# Patient Record
Sex: Male | Born: 1970 | Race: Black or African American | Hispanic: No | Marital: Married | Smoking: Never smoker
Health system: Southern US, Community
[De-identification: ages and names within clinical notes are randomized; demographics above are authoritative.]

---

## 2004-08-14 ENCOUNTER — Emergency Department: Payer: Self-pay | Admitting: Emergency Medicine

## 2005-01-07 ENCOUNTER — Emergency Department: Payer: Self-pay | Admitting: Internal Medicine

## 2005-04-17 ENCOUNTER — Ambulatory Visit: Payer: Self-pay | Admitting: Family Medicine

## 2005-06-09 ENCOUNTER — Ambulatory Visit: Payer: Self-pay | Admitting: Gastroenterology

## 2005-06-16 ENCOUNTER — Emergency Department: Payer: Self-pay | Admitting: Emergency Medicine

## 2005-06-18 ENCOUNTER — Ambulatory Visit: Payer: Self-pay | Admitting: Gastroenterology

## 2010-05-09 ENCOUNTER — Ambulatory Visit: Payer: Self-pay | Admitting: Internal Medicine

## 2015-06-22 ENCOUNTER — Emergency Department: Payer: BLUE CROSS/BLUE SHIELD

## 2015-06-22 ENCOUNTER — Encounter: Payer: Self-pay | Admitting: Emergency Medicine

## 2015-06-22 ENCOUNTER — Emergency Department
Admission: EM | Admit: 2015-06-22 | Discharge: 2015-06-22 | Disposition: A | Discharge status: Home / Self Care | Payer: BLUE CROSS/BLUE SHIELD | Attending: Emergency Medicine | Admitting: Emergency Medicine

## 2015-06-22 DIAGNOSIS — J189 Pneumonia, unspecified organism: Secondary | ICD-10-CM | POA: Diagnosis not present

## 2015-06-22 DIAGNOSIS — J984 Other disorders of lung: Secondary | ICD-10-CM

## 2015-06-22 DIAGNOSIS — R079 Chest pain, unspecified: Secondary | ICD-10-CM | POA: Diagnosis present

## 2015-06-22 LAB — BASIC METABOLIC PANEL
ANION GAP: 7 (ref 5–15)
BUN: 19 mg/dL (ref 6–20)
CHLORIDE: 104 mmol/L (ref 101–111)
CO2: 28 mmol/L (ref 22–32)
Calcium: 9.3 mg/dL (ref 8.9–10.3)
Creatinine, Ser: 1.12 mg/dL (ref 0.61–1.24)
GFR calc non Af Amer: >60 mL/min (ref >60)
Glucose, Bld: 89 mg/dL (ref 65–99)
POTASSIUM: 4.3 mmol/L (ref 3.5–5.1)
SODIUM: 139 mmol/L (ref 135–145)

## 2015-06-22 LAB — TROPONIN I: Troponin I: <0.03 ng/mL (ref <0.031)

## 2015-06-22 LAB — CBC
HCT: 38.4 % — ABNORMAL LOW (ref 40.0–52.0)
HEMOGLOBIN: 12.8 g/dL — AB (ref 13.0–18.0)
MCH: 30.9 pg (ref 26.0–34.0)
MCHC: 33.2 g/dL (ref 32.0–36.0)
MCV: 93 fL (ref 80.0–100.0)
Platelets: 165 10*3/uL (ref 150–440)
RBC: 4.13 MIL/uL — AB (ref 4.40–5.90)
RDW: 13.2 % (ref 11.5–14.5)
WBC: 7.8 10*3/uL (ref 3.8–10.6)

## 2015-06-22 MED ORDER — GUAIFENESIN-CODEINE 100-10 MG/5ML PO SOLN: 10.0000 mL | ORAL | Status: DC | PRN

## 2015-06-22 MED ORDER — LEVOFLOXACIN 750 MG PO TABS: 750.0000 mg | ORAL_TABLET | Freq: Every day | ORAL | Status: AC

## 2015-06-22 NOTE — ED Notes (Signed)
Pt to ed with c/o chest pain and sob that started about 4 hours pta. States it is constant and achy.  Pt also reports weakness associated with the pain.  Pt rates pain 7/10 at this time.

## 2015-06-22 NOTE — ED Notes (Signed)
Pt verbalized understanding of discharge instructions. NAD at this time. 

## 2015-06-22 NOTE — Discharge Instructions (Signed)

## 2015-06-22 NOTE — ED Provider Notes (Signed)
[facility] Emergency Department Provider Note  ____________________________________________  Time seen: Approximately 4:20 PM  I have reviewed the triage vital signs and the nursing notes.   HISTORY  Chief Complaint Chest Pain    HPI Dwayne Tucker is a 45 y.o. male who presents with the complaints of chest pain shortness of breath and heaviness is started about 4 hours prior to arrival. States onset this morning. Also reports some weakness associated with the pain. States it hurts to take a deep breath. Past medical history is unremarkable. Denies any nausea vomiting no sweating. Denies any radiation of pain to the neck or arm.   History reviewed. No pertinent past medical history.  There are no active problems to display for this patient.   History reviewed. No pertinent past surgical history.  Current Outpatient Rx  Name  Route  Sig  Dispense  Refill  . guaiFENesin-codeine 100-10 MG/5ML syrup   Oral   Take 10 mLs by mouth every 4 (four) hours as needed for cough.   180 mL   0   . levofloxacin (LEVAQUIN) 750 MG tablet   Oral   Take 1 tablet (750 mg total) by mouth daily.   10 tablet   0     Allergies Review of patient's allergies indicates no known allergies.  History reviewed. No pertinent family history.  Social History Social History  Substance Use Topics  . Smoking status: Never Smoker   . Smokeless tobacco: None  . Alcohol Use: Yes    Review of Systems Constitutional: No fever/chills Eyes: No visual changes. ENT: No sore throat. Cardiovascular: Positive for chest pain. Respiratory: Denies shortness of breath.Positive for heaviness and hurts to take deep breath. Musculoskeletal: Negative for back pain. Skin: Negative for rash. Neurological: Negative for headaches, focal weakness or numbness.  10-point ROS otherwise negative.  ____________________________________________   PHYSICAL EXAM:  VITAL SIGNS: ED Triage  Vitals  Enc Vitals Group     BP 06/22/15 1232 116/68 mmHg     Pulse Rate 06/22/15 1232 98     Resp 06/22/15 1232 20     Temp 06/22/15 1232 99.4 F (37.4 C)     Temp Source 06/22/15 1232 Oral     SpO2 06/22/15 1232 98 %     Weight 06/22/15 1232 155 lb (70.308 kg)     Height 06/22/15 1232 5\' 8"  (1.727 m)     Head Cir --      Peak Flow --      Pain Score 06/22/15 1232 7     Pain Loc --      Pain Edu? --      Excl. in GC? --     Constitutional: Alert and oriented. Well appearing and in no acute distress. Eyes: Conjunctivae are normal. PERRL. EOMI. Head: Atraumatic. Nose: No congestion/rhinnorhea. Mouth/Throat: Mucous membranes are moist.  Oropharynx non-erythematous. Neck: No stridor.   Cardiovascular: Normal rate, regular rhythm. Grossly normal heart sounds.  Good peripheral circulation. Respiratory: Normal respiratory effort.  No retractions. Lungs Decreased breath sounds with some coarse scattered rhonchi noted on the right greater than left. Gastrointestinal: Soft and nontender. No distention. No abdominal bruits. No CVA tenderness. Musculoskeletal: No lower extremity tenderness nor edema.  No joint effusions. Neurologic:  Normal speech and language. No gross focal neurologic deficits are appreciated. No gait instability. Skin:  Skin is warm, dry and intact. No rash noted. Psychiatric: Mood and affect are normal. Speech and behavior are normal.  ____________________________________________   LABS (  all labs ordered are listed, but only abnormal results are displayed)  Labs Reviewed  CBC - Abnormal; Notable for the following:    RBC 4.13 (*)    Hemoglobin 12.8 (*)    HCT 38.4 (*)    All other components within normal limits  BASIC METABOLIC PANEL  TROPONIN I   ____________________________________________  EKG  Normal sinus rhythm with no acute STEMI. No EKG changes. ____________________________________________  RADIOLOGY  Acute bilateral pneumonitis  noted. ____________________________________________   PROCEDURES  Procedure(s) performed: None  Critical Care performed: No  ____________________________________________   INITIAL IMPRESSION / ASSESSMENT AND PLAN / ED COURSE  Pertinent labs & imaging results that were available during my care of the patient were reviewed by me and considered in my medical decision making (see chart for details).  Acute bilateral pneumonia. Rx given for Levaquin 750 mg daily 10 days. Rx given for guaifenesin with codeine. USE 48 hours. Patient follow-up PCP or return to ER with any worsening symptomology. Encouraged use of Motrin and Tylenol as needed for fever chills or body aches. ____________________________________________   FINAL CLINICAL IMPRESSION(S) / ED DIAGNOSES  Final diagnoses:  Pneumonitis     This chart was dictated using voice recognition software/Dragon. Despite best efforts to proofread, errors can occur which can change the meaning. Any change was purely unintentional.   Evangeline Dakin, PA-C 06/22/15 1626  Sharman Cheek, MD 06/25/15 [phone]

## 2016-09-08 ENCOUNTER — Emergency Department
Admission: EM | Admit: 2016-09-08 | Discharge: 2016-09-08 | Disposition: A | Discharge status: Home / Self Care | Payer: BLUE CROSS/BLUE SHIELD | Attending: Emergency Medicine | Admitting: Emergency Medicine

## 2016-09-08 ENCOUNTER — Encounter: Payer: Self-pay | Admitting: *Deleted

## 2016-09-08 DIAGNOSIS — Y929 Unspecified place or not applicable: Secondary | ICD-10-CM | POA: Insufficient documentation

## 2016-09-08 DIAGNOSIS — Z23 Encounter for immunization: Secondary | ICD-10-CM | POA: Insufficient documentation

## 2016-09-08 DIAGNOSIS — Y939 Activity, unspecified: Secondary | ICD-10-CM | POA: Diagnosis not present

## 2016-09-08 DIAGNOSIS — S6991XA Unspecified injury of right wrist, hand and finger(s), initial encounter: Secondary | ICD-10-CM | POA: Diagnosis present

## 2016-09-08 DIAGNOSIS — W268XXA Contact with other sharp object(s), not elsewhere classified, initial encounter: Secondary | ICD-10-CM | POA: Insufficient documentation

## 2016-09-08 DIAGNOSIS — Y990 Civilian activity done for income or pay: Secondary | ICD-10-CM | POA: Insufficient documentation

## 2016-09-08 DIAGNOSIS — S61411A Laceration without foreign body of right hand, initial encounter: Secondary | ICD-10-CM | POA: Diagnosis not present

## 2016-09-08 MED ORDER — TETANUS-DIPHTH-ACELL PERTUSSIS 5-2.5-18.5 LF-MCG/0.5 IM SUSP
0.5000 mL | Freq: Once | INTRAMUSCULAR | Status: AC
  Administered 2016-09-08: 0.5 mL via INTRAMUSCULAR
  Filled 2016-09-08: qty 0.5

## 2016-09-08 MED ORDER — CEPHALEXIN 500 MG PO CAPS: 500.0000 mg | ORAL_CAPSULE | Freq: Three times a day (TID) | ORAL | 0 refills | Status: AC

## 2016-09-08 MED ORDER — LIDOCAINE HCL 1 % IJ SOLN
5.0000 mL | Freq: Once | INTRAMUSCULAR | Status: AC
  Administered 2016-09-08: 5 mL
  Filled 2016-09-08: qty 5

## 2016-09-08 NOTE — ED Notes (Signed)
Wound dressed with sterile dressing and home care discussed. Pt in NAD at this time. Pt verbalized understanding of follow up care and suture removal.

## 2016-09-08 NOTE — ED Provider Notes (Signed)
[facility] Emergency Department Provider Note  ____________________________________________  Time seen: Approximately 10:09 PM  I have reviewed the triage vital signs and the nursing notes.   HISTORY  Chief Complaint Laceration    HPI Dwayne Tucker is a 46 y.o. male presenting to the emergency department with a 3 cm right hand laceration after patient cut hand on a piece of metal at work. Patient denies radiculopathy, weakness or changes in sensation of the right upper extremity. Tetanus status is not up-to-date. No alleviating measures been attempted.   History reviewed. No pertinent past medical history.  There are no active problems to display for this patient.   History reviewed. No pertinent surgical history.  Prior to Admission medications   Medication Sig Start Date End Date Taking? Authorizing Provider  cephALEXin (KEFLEX) 500 MG capsule Take 1 capsule (500 mg total) by mouth 3 (three) times daily. 09/08/16 09/18/16  Orvil Feil, PA-C  guaiFENesin-codeine 100-10 MG/5ML syrup Take 10 mLs by mouth every 4 (four) hours as needed for cough. 06/22/15   Beers, Charmayne Sheer, PA-C    Allergies Patient has no known allergies.  History reviewed. No pertinent family history.  Social History Social History  Substance Use Topics  . Smoking status: Never Smoker  . Smokeless tobacco: Never Used  . Alcohol use Yes     Review of Systems  Constitutional: No fever/chills Eyes: No visual changes. No discharge ENT: No upper respiratory complaints. Cardiovascular: no chest pain. Respiratory: no cough. No SOB. Musculoskeletal: Negative for musculoskeletal pain. Skin: Patient has right hand laceration.  Neurological: Negative for headaches, focal weakness or numbness.   ____________________________________________   PHYSICAL EXAM:  VITAL SIGNS: ED Triage Vitals  Enc Vitals Group     BP 09/08/16 2051 131/76     Pulse Rate 09/08/16 2051 85    Resp 09/08/16 2051 16     Temp 09/08/16 2051 98.6 F (37 C)     Temp Source 09/08/16 2051 Oral     SpO2 09/08/16 2051 98 %     Weight 09/08/16 2046 160 lb (72.6 kg)     Height 09/08/16 2046 5\' 8"  (1.727 m)     Head Circumference --      Peak Flow --      Pain Score --      Pain Loc --      Pain Edu? --      Excl. in GC? --      Constitutional: Alert and oriented. Well appearing and in no acute distress. Eyes: Conjunctivae are normal. PERRL. EOMI. Head: Atraumatic.  Cardiovascular: Normal rate, regular rhythm. Normal S1 and S2.  Good peripheral circulation. Respiratory: Normal respiratory effort without tachypnea or retractions. Lungs CTAB. Good air entry to the bases with no decreased or absent breath sounds. Musculoskeletal: Full range of motion to all extremities. No gross deformities appreciated. Neurologic:  Normal speech and language. No gross focal neurologic deficits are appreciated.  Skin: Patient has 3 cm right hand laceration. Psychiatric: Mood and affect are normal. Speech and behavior are normal. Patient exhibits appropriate insight and judgement.   ____________________________________________   LABS (all labs ordered are listed, but only abnormal results are displayed)  Labs Reviewed - No data to display ____________________________________________  EKG   ____________________________________________  RADIOLOGY   No results found.  ____________________________________________    PROCEDURES  Procedure(s) performed:    Procedures    Medications  Tdap (BOOSTRIX) injection 0.5 mL (not administered)  lidocaine (XYLOCAINE) 1 % (  with pres) injection 5 mL (not administered)    LACERATION REPAIR Performed by: Orvil FeilJaclyn M Woods Authorized by: Orvil FeilJaclyn M Woods Consent: Verbal consent obtained. Risks and benefits: risks, benefits and alternatives were discussed Consent given by: patient Patient identity confirmed: provided demographic data Prepped and  Draped in normal sterile fashion Wound explored  Laceration Location: Right hand  Laceration Length: 3 cm  No Foreign Bodies seen or palpated  Anesthesia: local infiltration  Local anesthetic: lidocaine 1% without epinephrine  Anesthetic total: 3 ml  Irrigation method: syringe Amount of cleaning: standard  Skin closure: 4-0 Ethilon   Number of sutures: 7  Technique: Simple Interrupted  Patient tolerance: Patient tolerated the procedure well with no immediate complications.   ____________________________________________   INITIAL IMPRESSION / ASSESSMENT AND PLAN / ED COURSE  Pertinent labs & imaging results that were available during my care of the patient were reviewed by me and considered in my medical decision making (see chart for details).  Review of the [place] CSRS was performed in accordance of the [facility] prior to dispensing any controlled drugs.     Assessment and plan: Laceration of right hand without foreign body, initial encounter. Patient presents to the emergency department with a 3 cm right hand laceration. Patient underwent laceration repair in the emergency department without complication. Patient was advised to have sutures removed by primary care 9 days. Patient's tetanus status was updated in the emergency department. He was discharged with Keflex. Vital signs were reassuring prior to discharge. All patient questions were answered.   ____________________________________________  FINAL CLINICAL IMPRESSION(S) / ED DIAGNOSES  Final diagnoses:  Laceration of right hand without foreign body, initial encounter      NEW MEDICATIONS STARTED DURING THIS VISIT:  New Prescriptions   CEPHALEXIN (KEFLEX) 500 MG CAPSULE    Take 1 capsule (500 mg total) by mouth 3 (three) times daily.        This chart was dictated using voice recognition software/Dragon. Despite best efforts to proofread, errors can occur which can change the meaning. Any change was purely  unintentional.    Gasper LloydWoods, Jaclyn M, PA-C 09/08/16 2214    Sharman CheekStafford, Phillip, MD 09/09/16 [phone]

## 2016-09-08 NOTE — ED Triage Notes (Signed)
Pt reports being cut by metal piece on right hand. Bleeding controlled at this time. Pt reports having feeling in hand and movement intact. Cap refill < 3 sec.

## 2018-09-30 ENCOUNTER — Encounter: Payer: Self-pay | Admitting: Emergency Medicine

## 2018-09-30 ENCOUNTER — Emergency Department: Payer: BC Managed Care – PPO

## 2018-09-30 ENCOUNTER — Emergency Department
Admission: EM | Admit: 2018-09-30 | Discharge: 2018-09-30 | Disposition: A | Discharge status: Home / Self Care | Payer: BC Managed Care – PPO | Attending: Emergency Medicine | Admitting: Emergency Medicine

## 2018-09-30 ENCOUNTER — Other Ambulatory Visit: Payer: Self-pay

## 2018-09-30 DIAGNOSIS — Y998 Other external cause status: Secondary | ICD-10-CM | POA: Insufficient documentation

## 2018-09-30 DIAGNOSIS — Y92481 Parking lot as the place of occurrence of the external cause: Secondary | ICD-10-CM | POA: Insufficient documentation

## 2018-09-30 DIAGNOSIS — Y9389 Activity, other specified: Secondary | ICD-10-CM | POA: Insufficient documentation

## 2018-09-30 DIAGNOSIS — M542 Cervicalgia: Secondary | ICD-10-CM | POA: Diagnosis present

## 2018-09-30 DIAGNOSIS — R51 Headache: Secondary | ICD-10-CM | POA: Insufficient documentation

## 2018-09-30 DIAGNOSIS — M7918 Myalgia, other site: Secondary | ICD-10-CM

## 2018-09-30 MED ORDER — CYCLOBENZAPRINE HCL 5 MG PO TABS: 5.0000 mg | ORAL_TABLET | Freq: Three times a day (TID) | ORAL | 0 refills | Status: AC | PRN

## 2018-09-30 MED ORDER — IBUPROFEN 800 MG PO TABS: 800.0000 mg | ORAL_TABLET | Freq: Three times a day (TID) | ORAL | 0 refills | Status: DC | PRN
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
## 2018-09-30 NOTE — ED Notes (Signed)
Wrong chart was e-signed.
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
## 2018-12-10 ENCOUNTER — Other Ambulatory Visit: Payer: Self-pay | Admitting: Internal Medicine

## 2018-12-10 DIAGNOSIS — M7989 Other specified soft tissue disorders: Secondary | ICD-10-CM

## 2018-12-14 ENCOUNTER — Encounter (INDEPENDENT_AMBULATORY_CARE_PROVIDER_SITE_OTHER): Payer: Self-pay

## 2018-12-14 ENCOUNTER — Other Ambulatory Visit: Payer: Self-pay

## 2018-12-14 ENCOUNTER — Ambulatory Visit
Admission: RE | Admit: 2018-12-14 | Discharge: 2018-12-14 | Disposition: A | Discharge status: Home / Self Care | Payer: BC Managed Care – PPO | Source: Ambulatory Visit | Attending: Internal Medicine | Admitting: Internal Medicine

## 2018-12-14 DIAGNOSIS — M7989 Other specified soft tissue disorders: Secondary | ICD-10-CM | POA: Diagnosis not present

## 2020-07-18 ENCOUNTER — Other Ambulatory Visit: Payer: Self-pay | Admitting: Physician Assistant

## 2020-07-18 DIAGNOSIS — M5412 Radiculopathy, cervical region: Secondary | ICD-10-CM

## 2020-07-30 ENCOUNTER — Other Ambulatory Visit: Payer: Self-pay

## 2020-07-30 ENCOUNTER — Ambulatory Visit
Admission: RE | Admit: 2020-07-30 | Discharge: 2020-07-30 | Disposition: A | Discharge status: Home / Self Care | Payer: 59 | Source: Ambulatory Visit | Attending: Physician Assistant | Admitting: Physician Assistant

## 2020-07-30 DIAGNOSIS — M5412 Radiculopathy, cervical region: Secondary | ICD-10-CM | POA: Insufficient documentation

## 2022-03-11 IMAGING — MR MR CERVICAL SPINE W/O CM
5 series · 40 of 48 positions shown · non-contrast
Comparison: Cervical spine x-rays dated September 30, 2018.

CLINICAL DATA: Left arm pain for the past year.

EXAM:
MRI CERVICAL SPINE WITHOUT CONTRAST
TECHNIQUE: Multiplanar, multisequence MR imaging of the cervical spine was
performed. No intravenous contrast was administered.

[Series 5: T2 · sagittal · 3.0mm · 0.62mm/px · 6 of 15 slices shown (1 of 2)]
[im 1/15]
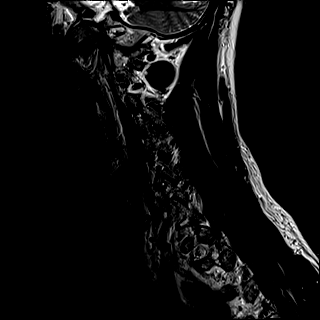
[im 3/15]
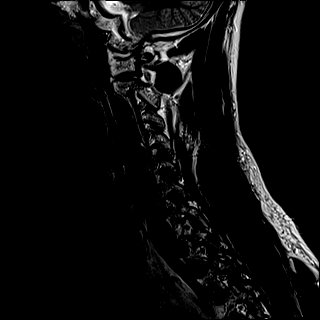
[im 6/15]
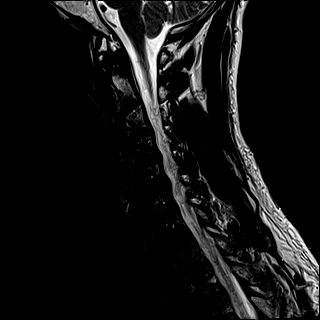
[im 9/15]
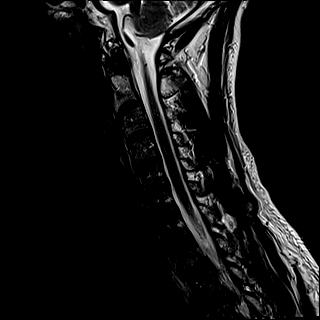
[im 12/15]
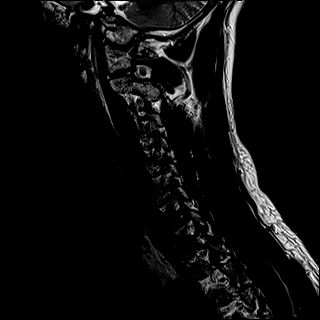
[im 15/15]
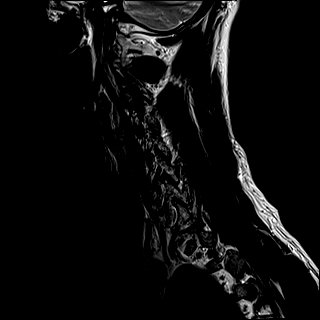

[Series 6: FLAIR · sagittal · 3.0mm · 0.78mm/px · 7 of 15 slices shown]
[im 1/15]
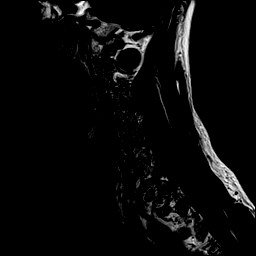
[im 3/15]
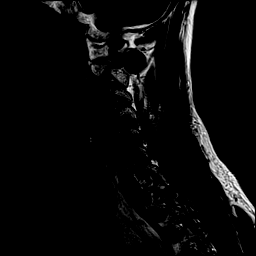
[im 5/15]
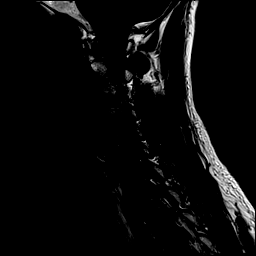
[im 8/15]
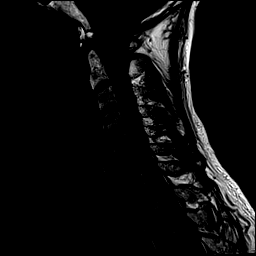
[im 10/15]
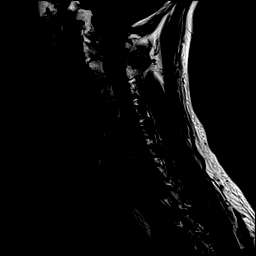
[im 12/15]
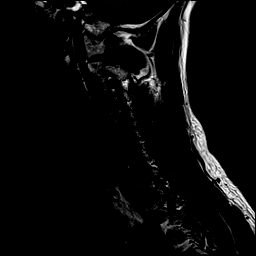
[im 15/15]
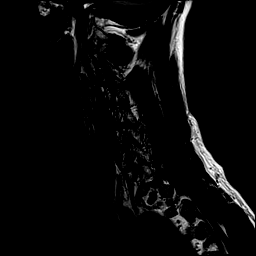

[Series 7: STIR · sagittal · 3.0mm · 0.62mm/px · 7 of 15 slices shown]
[im 1/15]
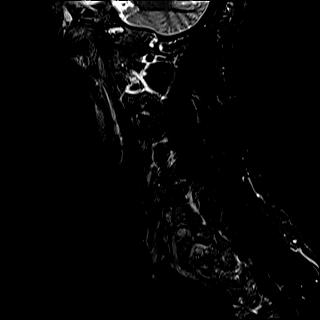
[im 3/15]
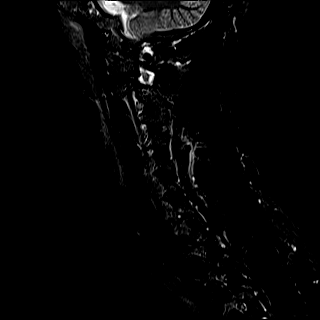
[im 5/15]
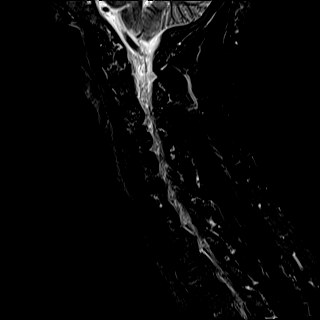
[im 8/15]
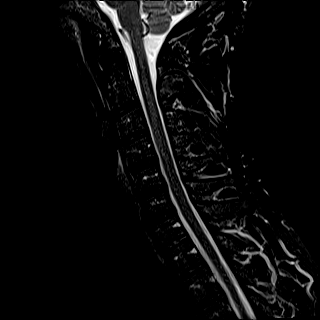
[im 10/15]
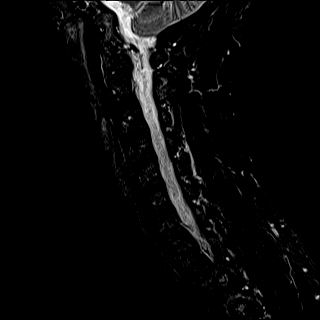
[im 12/15]
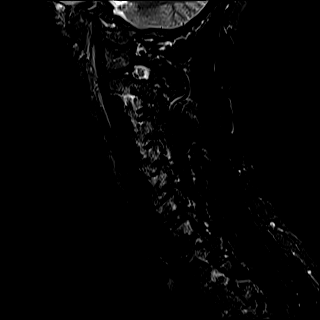
[im 15/15]
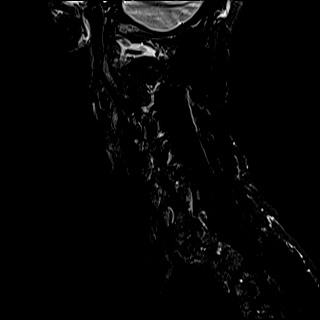

[Series 8: T2 · axial · 3.0mm · 0.70mm/px · z∈[-101,-3]mm · 12 of 31 slices shown (2 of 2)]
[im 1/31]
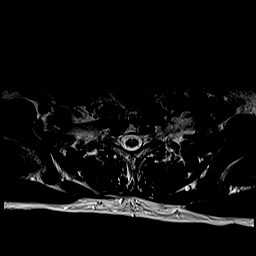
[im 3/31]
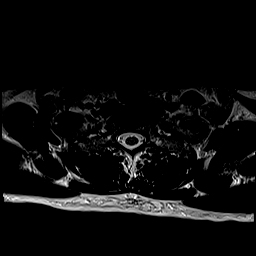
[im 5/31]
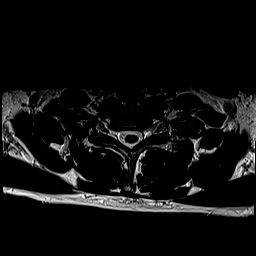
[im 7/31]
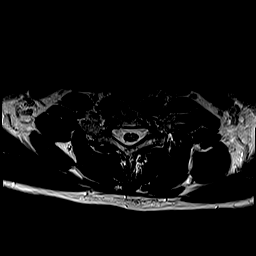
[im 10/31]
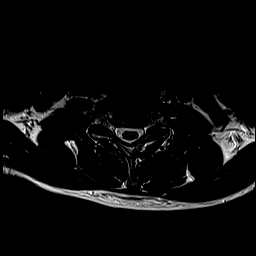
[im 12/31]
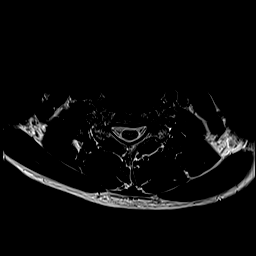
[im 14/31]
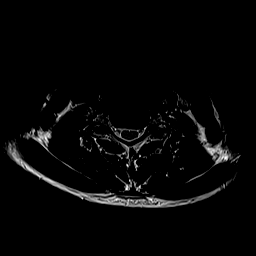
[im 17/31]
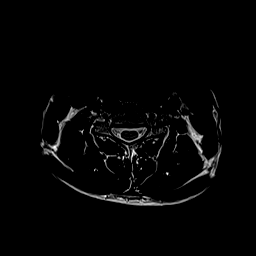
[im 19/31]
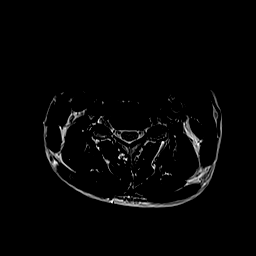
[im 21/31]
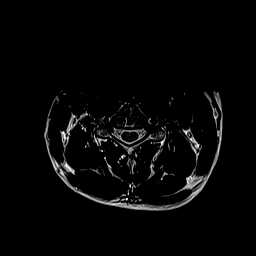
[im 26/31]
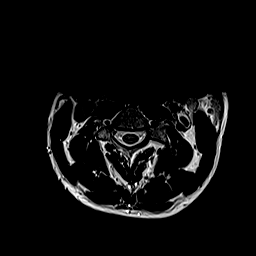
[im 31/31]
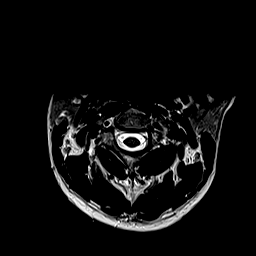

[Series 9: ax mpgr · axial · 3.0mm · 0.35mm/px · z∈[-101,-3]mm · 8 of 31 slices shown]
[im 1/31]
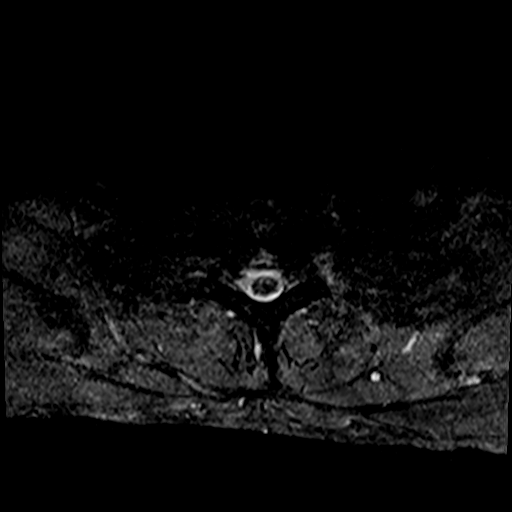
[im 5/31]
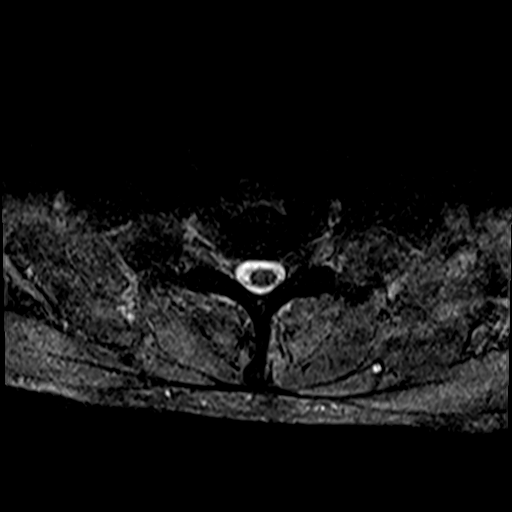
[im 10/31]
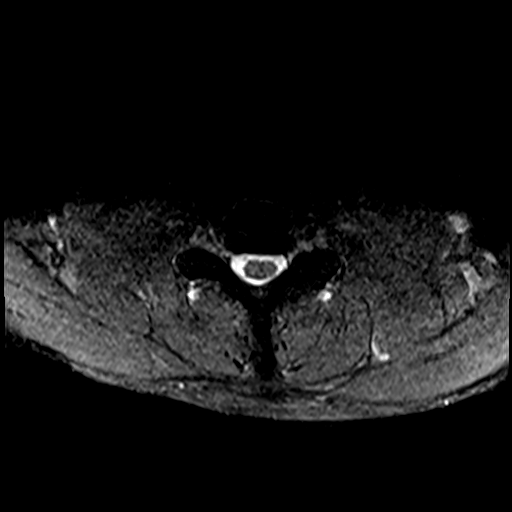
[im 14/31]
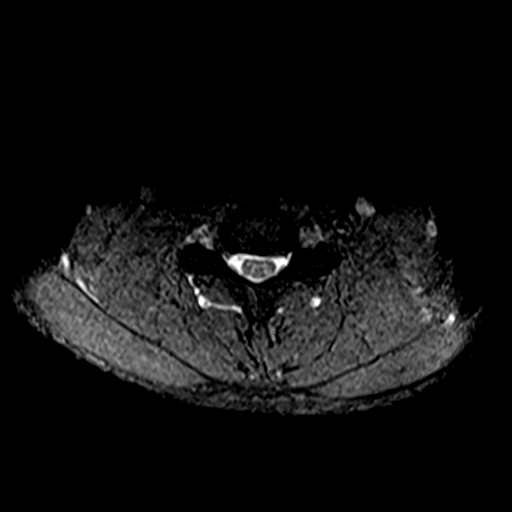
[im 17/31]
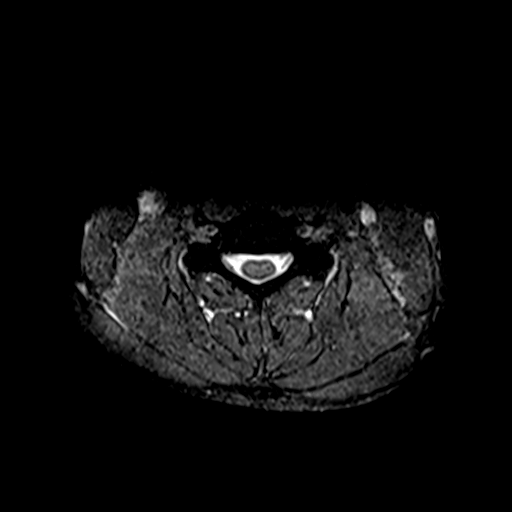
[im 21/31]
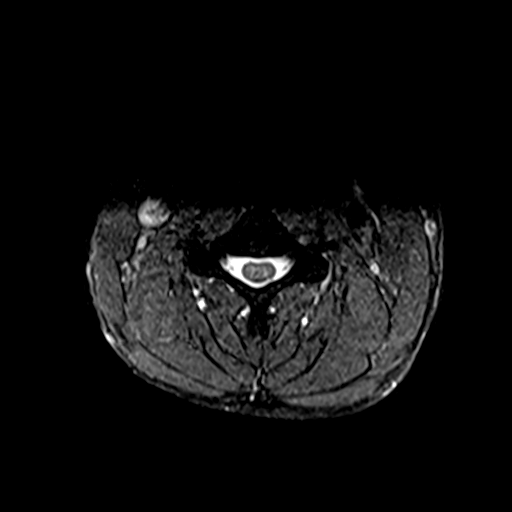
[im 26/31]
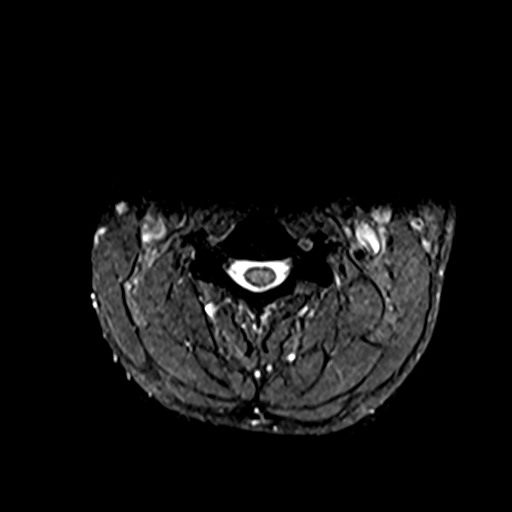
[im 31/31]
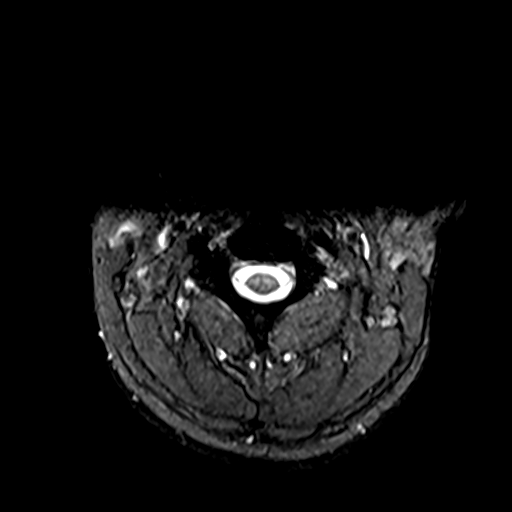

[40 of 48 positions shown; findings below may reference images not displayed]

FINDINGS: Alignment: Straightening of the normal cervical lordosis. No
listhesis.

Vertebrae: No fracture, evidence of discitis, or bone lesion.

Cord: Normal signal and morphology.

Posterior Fossa, vertebral arteries, paraspinal tissues: Negative.

Disc levels:

C2-C3:  Negative.

C3-C4:  Negative.

C4-C5: Minimal disc bulging. Mild left facet uncovertebral
hypertrophy. Mild left neuroforaminal stenosis. No spinal canal or
right neuroforaminal stenosis.

C5-C6: Mild disc bulging eccentric to the left. Mild left facet
uncovertebral hypertrophy. Moderate left neuroforaminal stenosis. No
spinal canal or right neuroforaminal stenosis.

C6-C7: Minimal disc bulging. Mild left facet uncovertebral
hypertrophy. Mild left neuroforaminal stenosis. No spinal canal or
right neuroforaminal stenosis.

C7-T1:  Negative.
IMPRESSION: 1. Mild multilevel cervical spondylosis as described above. Moderate
left neuroforaminal stenosis at C5-C6.

## 2022-12-23 ENCOUNTER — Encounter: Payer: Self-pay | Admitting: Urology

## 2022-12-23 ENCOUNTER — Ambulatory Visit: Payer: 59 | Admitting: Urology

## 2022-12-23 VITALS — BP 115/78 | HR 70 | Ht 68.0 in | Wt 160.0 lb

## 2022-12-23 DIAGNOSIS — Z3009 Encounter for other general counseling and advice on contraception: Secondary | ICD-10-CM

## 2022-12-23 DIAGNOSIS — Z125 Encounter for screening for malignant neoplasm of prostate: Secondary | ICD-10-CM

## 2022-12-23 MED ORDER — DIAZEPAM 5 MG PO TABS: 5.0000 mg | ORAL_TABLET | Freq: Once | ORAL | 0 refills | Status: DC | PRN

## 2022-12-23 NOTE — Patient Instructions (Addendum)
Pre-Vasectomy Instructions  STOP all aspirin or blood thinners (Aspirin, Plavix, Coumadin, Warfarin, Motrin, Ibuprofen, Advil, Aleve, Naproxen, Naprosyn) for 7 days prior to the procedure.  If you have any questions about stopping these medications please contact your primary care physician or cardiologist.  Shave all hair from the upper scrotum on the day of the procedure.  This means just under the penis onto the scrotal sac.  The area shaved should measure about 2-3 inches around.  You may lather the scrotum with soap and water, and shave with a safety razor.  After shaving the area, thoroughly wash the penis and the scrotum, then shower or bathe to remove all the loose hairs.  If needed, wash the area again just before coming in for your Vasectomy.  It is recommended to have a light meal an hour or so prior to the procedure.  Bring a scrotal support (jock strap or suspensory, or tight jockey shorts or underwear).  Wear comfortable pants or shorts.  While the actual procedure usually takes about 45 minutes, you should be prepared to stay in the office for approximately one hour.  Bring someone with you to drive you home.  If you have any questions or concerns, please feel free to call the office at [phone].  Vasectomy Vasectomy is a procedure to cut and then tie or burn the ends of the vas deferens. The vas deferens is a tube that carries sperm from the testicle to the urethra. This procedure blocks sperm from being released during sex. This ensures that sperm does not go into the vagina. A vasectomy does not affect your ability to have sex or your desire for sex. Also, it does not prevent sexually transmitted infections, or STIs. Vasectomy is a permanent and effective form of birth control. You should have a vasectomy only when you and your partner are sure you do not want children in the future. Do not get this procedure when you are stressed, such as after divorce or pregnancy  loss. Tell a health care provider about: Any allergies you have. All medicines you are taking. These include vitamins, herbs, eye drops, creams, and over-the-counter medicines. Any problems you or family members have had with anesthesia. Any bleeding problems you have. Any surgeries you have had. Any medical conditions you have. What are the risks? Your provider will talk with you about risks. These may include: Infection. Bleeding and swelling of the scrotum. The scrotum is the sac that contains the testicles. Allergies to medicines. Failure of the procedure to prevent pregnancy. There is a very small chance that the tied or burned parts of the vas deferens may reconnect. If this happens, you could still make a person pregnant. Pain in the scrotum that goes on after you heal from the procedure. What happens before the procedure? Medicines Ask your health care provider about: Changing or stopping your regular medicines. These include any diabetes medicines or blood thinners you take. Taking medicines such as aspirin and ibuprofen. These medicines can thin your blood. Do not take them unless your provider tells you to take them. Taking over-the-counter medicines, vitamins, herbs, and supplements. You may be told to take a sedative a few hours before the procedure. A sedative helps you relax. Surgery safety Ask your provider: How your surgery site will be marked. What steps will be taken to help prevent infection. These steps may include: Removing hair at the surgery site. Washing skin with a soap that kills germs. Taking antibiotics. General instructions Do  not use any products that contain nicotine or tobacco for at least 4 weeks before the procedure. These products include cigarettes, chewing tobacco, and vaping devices, such as e-cigarettes. If you need help quitting, ask your provider. If you'll be going home right after the procedure, plan to have a responsible adult: Take you  home from the hospital or clinic. You'll not be allowed to drive. Care for you for the time you are told. What happens during the procedure?  You may be given: A sedative. This helps you relax. You may also be told to take this a few hours before the procedure. Anesthesia. This keeps you from feeling pain. It will numb certain areas of your body. Your provider will feel for your vas deferens. To get to the vas deferens, your provider may: Make a very small cut, or incision, in your scrotum. Make a hole by piercing the scrotum. Your vas deferens will be pulled out of your scrotum and cut. To close it, the cut ends of the vas deferens will be tied or burned. The vas deferens will be put back into your scrotum. The cut or the hole in the scrotum will be closed with stitches. The stitches will dissolve and will not need to be removed. The procedure will be done again on the other side of your scrotum. The procedure may vary among providers and hospitals. What happens after the procedure? You will be monitored to make sure that you do not have problems. You will be asked not to ejaculate for at least 1 week after the procedure, or for as long as you are told. You will need to use another form of birth control for 2-4 months after the procedure. Do this until your provider confirms that there's no sperm in your semen. You may be given something to wear to support your scrotum. This includes a jockstrap or underwear with a pouch. If you were given a sedative during your procedure, do not drive or use machines until your provider says that it's safe. This information is not intended to replace advice given to you by your health care provider. Make sure you discuss any questions you have with your health care provider. Document Revised: 04/15/2022 Document Reviewed: 04/15/2022 Elsevier Patient Education  2024 [employer]vier Inc.  Prostate Cancer Screening  Prostate cancer screening is testing that is  done to check for the presence of prostate cancer in men. The prostate gland is a walnut-sized gland that is located below the bladder and in front of the rectum in males. The function of the prostate is to add fluid to semen during ejaculation. Prostate cancer is one of the most common types of cancer in men. Who should have prostate cancer screening? Screening recommendations vary based on age and other risk factors, as well as between the professional organizations who make the recommendations. In general, screening is recommended if: You are age 59 to 64 and have an average risk for prostate cancer. You should talk with your health care provider about your need for screening and how often screening should be done. Because most prostate cancers are slow growing and will not cause death, screening in this age group is generally reserved for men who have a 10- to 15-year life expectancy. You are younger than age 66, and you have these risk factors: Having a father, brother, or uncle who has been diagnosed with prostate cancer. The risk is higher if your family member's cancer occurred at an early age or if  you have multiple family members with prostate cancer at an early age. Being a male who is [place] or is of [place] or sub-Saharan African descent. In general, screening is not recommended if: You are younger than age 22. You are between the ages of 80 and 21 and you have no risk factors. You are 19 years of age or older. At this age, the risks that screening can cause are greater than the benefits that it may provide. If you are at high risk for prostate cancer, your health care provider may recommend that you have screenings more often or that you start screening at a younger age. How is screening for prostate cancer done? The recommended prostate cancer screening test is a blood test called the prostate-specific antigen (PSA) test. PSA is a protein that is made in the prostate. As you age, your  prostate naturally produces more PSA. Abnormally high PSA levels may be caused by: Prostate cancer. An enlarged prostate that is not caused by cancer (benign prostatic hyperplasia, or BPH). This condition is very common in older men. A prostate gland infection (prostatitis) or urinary tract infection. Certain medicines such as male hormones (like testosterone) or other medicines that raise testosterone levels. A rectal exam may be done as part of prostate cancer screening to help provide information about the size of your prostate gland. When a rectal exam is performed, it should be done after the PSA level is drawn to avoid any effect on the results. Depending on the PSA results, you may need more tests, such as: A physical exam to check the size of your prostate gland, if not done as part of screening. Blood and imaging tests. A procedure to remove tissue samples from your prostate gland for testing (biopsy). This is the only way to know for certain if you have prostate cancer. What are the benefits of prostate cancer screening? Screening can help to identify cancer at an early stage, before symptoms start and when the cancer can be treated more easily. There is a small chance that screening may lower your risk of dying from prostate cancer. The chance is small because prostate cancer is a slow-growing cancer, and most men with prostate cancer die from a different cause. What are the risks of prostate cancer screening? The main risk of prostate cancer screening is diagnosing and treating prostate cancer that would never have caused any symptoms or problems. This is called overdiagnosisand overtreatment. PSA screening cannot tell you if your PSA is high due to cancer or a different cause. A prostate biopsy is the only procedure to diagnose prostate cancer. Even the results of a biopsy may not tell you if your cancer needs to be treated. Slow-growing prostate cancer may not need any treatment other  than monitoring, so diagnosing and treating it may cause unnecessary stress or other side effects. Questions to ask your health care provider When should I start prostate cancer screening? What is my risk for prostate cancer? How often do I need screening? What type of screening tests do I need? How do I get my test results? What do my results mean? Do I need treatment? Where to find more information The American Cancer Society: www.cancer.org American Urological Association: www.auanet.org Contact a health care provider if: You have difficulty urinating. You have pain when you urinate or ejaculate. You have blood in your urine or semen. You have pain in your back or in the area of your prostate. Summary Prostate cancer is a common type  of cancer in men. The prostate gland is located below the bladder and in front of the rectum. This gland adds fluid to semen during ejaculation. Prostate cancer screening may identify cancer at an early stage, when the cancer can be treated more easily and is less likely to have spread to other areas of the body. The prostate-specific antigen (PSA) test is the recommended screening test for prostate cancer, but it has associated risks. Discuss the risks and benefits of prostate cancer screening with your health care provider. If you are age 65 or older, the risks that screening can cause are greater than the benefits that it may provide. This information is not intended to replace advice given to you by your health care provider. Make sure you discuss any questions you have with your health care provider. Document Revised: 08/06/2020 Document Reviewed: 08/06/2020 Elsevier Patient Education  2024 [employer].

## 2022-12-23 NOTE — Progress Notes (Unsigned)
   12/23/22 3:18 PM   Dwayne Tucker 05-10-1970 [id number]  CC: Discuss vasectomy, PSA screening  HPI: 52 year old male interested in vasectomy for permanent sterilization.  PSA was normal at 0.75 from November 2023, and stable from prior values.    Family History: No family history on file.  Social History:  reports that he has never smoked. He has never used smokeless tobacco. He reports current alcohol use. He reports that he does not use drugs.  Physical Exam: BP 115/78 (BP Location: Left Arm, Patient Position: Sitting, Cuff Size: Normal)   Pulse 70   Ht 5\' 8"  (1.727 m)   Wt 160 lb (72.6 kg)   BMI 24.33 kg/m    Constitutional:  Alert and oriented, No acute distress. Cardiovascular: No clubbing, cyanosis, or edema. Respiratory: Normal respiratory effort, no increased work of breathing. GI: Abdomen is soft, nontender, nondistended, no abdominal masses GU: Phallus with patent meatus, vas deferens easily palpable bilaterally, no testicular masses or abnormalities   Assessment & Plan:   Healthy 52 year old male interested in vasectomy for permanent sterilization, as well as to discuss PSA screening.  We reviewed the AUA guidelines regarding PSA screening and I recommended continuing screening every other year through age 36, reassurance provided regarding normal and stable PSA values.  We discussed the risks and benefits of vasectomy at length.  Vasectomy is intended to be a permanent form of contraception, and does not produce immediate sterility.  Following vasectomy another form of contraception is required until vas occlusion is confirmed by a post-vasectomy semen analysis obtained 2-3 months after the procedure.  Even after vas occlusion is confirmed, vasectomy is not 100% reliable in preventing pregnancy, and the failure rate is approximately 02/1998.  Repeat vasectomy is required in less than 1% of patients.  He should refrain from ejaculation for 1 week after vasectomy.   Options for fertility after vasectomy include vasectomy reversal, and sperm retrieval with in vitro fertilization or ICSI.  These options are not always successful and may be expensive.  Finally, there are other permanent and non-permanent alternatives to vasectomy available. There is no risk of erectile dysfunction, and the volume of semen will be similar to prior, as the majority of the ejaculate is from the prostate and seminal vesicles.   The procedure takes ~20 minutes.  We recommend patients take 5-10 mg of Valium 30 minutes prior, and he will need a driver post-procedure.  Local anesthetic is injected into the scrotal skin and a small segment of the vas deferens is removed, and the ends occluded. The complication rate is approximately 1-2%, and includes bleeding, infection, and development of chronic scrotal pain.  PLAN: Schedule vasectomy Valium sent to pharmacy  Legrand Rams, MD 12/23/2022  [facility] [address] [place], [place] [address] [phone]

## 2023-01-07 ENCOUNTER — Ambulatory Visit (INDEPENDENT_AMBULATORY_CARE_PROVIDER_SITE_OTHER): Payer: 59 | Admitting: Urology

## 2023-01-07 ENCOUNTER — Encounter: Payer: Self-pay | Admitting: Urology

## 2023-01-07 VITALS — BP 127/82 | HR 82 | Ht 68.0 in | Wt 163.0 lb

## 2023-01-07 DIAGNOSIS — Z302 Encounter for sterilization: Secondary | ICD-10-CM | POA: Diagnosis not present

## 2023-01-07 DIAGNOSIS — Z9852 Vasectomy status: Secondary | ICD-10-CM

## 2023-01-07 NOTE — Patient Instructions (Signed)

## 2023-01-07 NOTE — Progress Notes (Signed)
VASECTOMY PROCEDURE NOTE:  The patient was taken to the minor procedure room and placed in the supine position. His genitals were prepped and draped in the usual sterile fashion. The right vas deferens was brought up to the skin of the right upper scrotum. The skin overlying it was anesthetized with 1% lidocaine without epinephrine, anesthetic was also injected alongside the vas deferens in the direction of the inguinal canal. The no scalpel vasectomy instrument was used to make a small perforation in the scrotal skin. The vasectomy clamp was used to grasp the vas deferens. It was carefully dissected free from surrounding structures. A 1cm segment of the vas was removed, and the cut ends of the mucosa were cauterized. No significant bleeding was noted. The vas deferens was returned to the scrotum. The skin incision was closed with a simple interrupted stitch of 4-0 chromic.  Attention was then turned to the left side. The left vasectomy was performed in the same exact fashion. Sterile dressings were placed over each incision. The patient tolerated the procedure well.  IMPRESSION/DIAGNOSIS: The patient is a 52 year old gentleman who underwent a vasectomy today. Post-procedure instructions were reviewed. I stressed the importance of continuing to use birth control until he provides a semen specimen more than 2 months from now that demonstrates azoospermia.  We discussed return precautions including fever over 101, significant bleeding or hematoma, or uncontrolled pain. I also stressed the importance of avoiding strenuous activity for one week, no sexual activity or ejaculations for 5 days, intermittent icing over the next 48 hours, and scrotal support.   PLAN: The patient will be advised of his semen analysis results when available.  Legrand Rams, MD 01/07/2023

## 2023-04-09 ENCOUNTER — Other Ambulatory Visit: Payer: 59

## 2023-04-09 DIAGNOSIS — Z9852 Vasectomy status: Secondary | ICD-10-CM

## 2023-04-10 LAB — POST-VAS SPERM EVALUATION,QUAL: Volume: 1.1 mL

## 2023-04-14 ENCOUNTER — Telehealth: Payer: Self-pay

## 2023-04-14 NOTE — Telephone Encounter (Signed)
 Called pt informed him of the information below. Pt voiced understanding. Lab requisition placed at reception for patient to pick up.

## 2023-04-14 NOTE — Telephone Encounter (Signed)
-----   Message from Sondra Come sent at 04/14/2023  8:11 AM EST ----- Sperm still present, please repeat more sensitive semen analysis  Legrand Rams, MD 04/14/2023

## 2023-06-30 ENCOUNTER — Other Ambulatory Visit: Payer: Self-pay | Admitting: Urology

## 2023-07-01 LAB — POST VAS SEMEN ANALYSIS, AUA
Total Immotile Sperm: 0.09 x10E6/mL (ref <0.10)
Volume: <0.2 mL

## 2023-07-02 ENCOUNTER — Telehealth: Payer: Self-pay

## 2023-07-02 NOTE — Telephone Encounter (Signed)
-----   Message from Lawerence Pressman sent at 07/02/2023 12:31 PM EDT ----- Dwayne Tucker news, no residual sperm on semen analysis, can discontinue alternative birth control  Jay Meth, MD 07/02/2023

## 2023-07-02 NOTE — Telephone Encounter (Signed)
Called pt informed him of the information below. Pt voiced understanding.  

## 2023-09-18 ENCOUNTER — Encounter: Payer: Self-pay | Admitting: *Deleted

## 2023-10-01 ENCOUNTER — Encounter: Payer: Self-pay | Admitting: Anesthesiology

## 2023-10-02 ENCOUNTER — Encounter: Admission: RE | Payer: Self-pay | Source: Home / Self Care

## 2023-10-02 ENCOUNTER — Ambulatory Visit: Admission: RE | Admit: 2023-10-02 | Source: Home / Self Care

## 2023-10-02 SURGERY — COLONOSCOPY
Anesthesia: General

## 2023-11-04 ENCOUNTER — Encounter: Payer: Self-pay | Admitting: *Deleted

## 2023-11-09 ENCOUNTER — Telehealth: Payer: Self-pay

## 2023-11-09 NOTE — Telephone Encounter (Signed)
 Pt called in with c/o decreased amount of ejaculation fluid since his Vasectomy last year. Pt is concerned with a possible blockage. His primary care instructed him to f/u with urology. I was able to book office visit with Dr. Francisca.

## 2023-11-16 ENCOUNTER — Encounter: Payer: Self-pay | Admitting: *Deleted

## 2023-11-17 ENCOUNTER — Ambulatory Visit: Admitting: Urology

## 2023-11-17 ENCOUNTER — Encounter: Payer: Self-pay | Admitting: Urology

## 2023-11-17 VITALS — BP 108/73 | HR 56 | Ht 68.0 in | Wt 160.0 lb

## 2023-11-17 DIAGNOSIS — Z9852 Vasectomy status: Secondary | ICD-10-CM | POA: Diagnosis not present

## 2023-11-17 NOTE — Progress Notes (Signed)
   11/17/2023 3:40 PM   Dwayne Tucker [birth year] [id number]  Reason for visit: Ejaculatory concerns  History: Underwent uncomplicated vasectomy November 2024, postvasectomy analysis with no sperm  Physical Exam: BP 108/73 (BP Location: Left Arm, Patient Position: Sitting, Cuff Size: Normal)   Pulse (!) 56   Ht 5' 8 (1.727 m)   Wt 160 lb (72.6 kg)   SpO2 97%   BMI 24.33 kg/m    Today: He reports decreased ejaculate volume since the procedure, denies any urinary symptoms or problems with erections  Plan:   Reassurance provided, can check a morning testosterone and call with results   Dwayne JAYSON Burnet, MD  [facility] [address] [place], [place] [address] [phone]

## 2023-11-27 ENCOUNTER — Ambulatory Visit: Admitting: Anesthesiology

## 2023-11-27 ENCOUNTER — Encounter: Admission: RE | Disposition: A | Payer: Self-pay | Source: Home / Self Care | Attending: Gastroenterology

## 2023-11-27 ENCOUNTER — Ambulatory Visit
Admission: RE | Admit: 2023-11-27 | Discharge: 2023-11-27 | Disposition: A | Discharge status: Home / Self Care | Payer: Self-pay | Attending: Gastroenterology | Admitting: Gastroenterology

## 2023-11-27 ENCOUNTER — Other Ambulatory Visit: Payer: Self-pay

## 2023-11-27 DIAGNOSIS — K640 First degree hemorrhoids: Secondary | ICD-10-CM | POA: Diagnosis not present

## 2023-11-27 DIAGNOSIS — Z1211 Encounter for screening for malignant neoplasm of colon: Secondary | ICD-10-CM | POA: Diagnosis present

## 2023-11-27 DIAGNOSIS — K5730 Diverticulosis of large intestine without perforation or abscess without bleeding: Secondary | ICD-10-CM | POA: Insufficient documentation

## 2023-11-27 DIAGNOSIS — D128 Benign neoplasm of rectum: Secondary | ICD-10-CM | POA: Diagnosis not present

## 2023-11-27 HISTORY — PX: POLYPECTOMY: SHX149

## 2023-11-27 HISTORY — PX: COLONOSCOPY: SHX5424

## 2023-11-27 SURGERY — COLONOSCOPY
Anesthesia: General

## 2023-11-27 MED ORDER — LIDOCAINE HCL (CARDIAC) PF 100 MG/5ML IV SOSY
PREFILLED_SYRINGE | INTRAVENOUS | Status: DC | PRN
  Administered 2023-11-27: 60 mg via INTRAVENOUS

## 2023-11-27 MED ORDER — PROPOFOL 10 MG/ML IV BOLUS
INTRAVENOUS | Status: DC | PRN
  Administered 2023-11-27 (×2): 50 mg via INTRAVENOUS

## 2023-11-27 MED ORDER — SODIUM CHLORIDE 0.9 % IV SOLN: INTRAVENOUS | Status: DC

## 2023-11-27 MED ORDER — DEXMEDETOMIDINE HCL IN NACL 80 MCG/20ML IV SOLN
INTRAVENOUS | Status: DC | PRN
Start: 2023-11-27 — End: 2023-11-27
  Administered 2023-11-27: 8 ug via INTRAVENOUS
  Administered 2023-11-27: 12 ug via INTRAVENOUS

## 2023-11-27 MED ORDER — PROPOFOL 500 MG/50ML IV EMUL
INTRAVENOUS | Status: DC | PRN
  Administered 2023-11-27: 75 ug/kg/min via INTRAVENOUS

## 2023-11-27 NOTE — H&P (Signed)
 Outpatient short stay form Pre-procedure 11/27/2023  [name] Schick, MD  Primary Physician: Dwayne Lenis, MD  Reason for visit:  Screening  History of present illness:    53 y/o gentleman with no significant PMH here for screening colonoscopy. Had normal colonoscopy over 10 years ago. No blood thinners. No family history of GI malignancies.    Current Facility-Administered Medications:    0.9 %  sodium chloride infusion, , Intravenous, Continuous, Elfriede Bonini, [name], MD, Last Rate: 20 mL/hr at 11/27/23 1333, New Bag at 11/27/23 1333  No medications prior to admission.     No Known Allergies   History reviewed. No pertinent past medical history.  Review of systems:  Otherwise negative.    Physical Exam  Gen: Alert, oriented. Appears stated age.  HEENT: PERRLA. Lungs: No respiratory distress CV: RRR Abd: soft, benign, no masses Ext: No edema    Planned procedures: Proceed with colonoscopy. The patient understands the nature of the planned procedure, indications, risks, alternatives and potential complications including but not limited to bleeding, infection, perforation, damage to internal organs and possible oversedation/side effects from anesthesia. The patient agrees and gives consent to proceed.  Please refer to procedure notes for findings, recommendations and patient disposition/instructions.     [name] Schick, MD [facility]

## 2023-11-27 NOTE — Anesthesia Postprocedure Evaluation (Signed)
 Anesthesia Post Note  Patient: Dwayne Tucker  Procedure(s) Performed: COLONOSCOPY POLYPECTOMY, INTESTINE  Patient location during evaluation: PACU Anesthesia Type: General Level of consciousness: awake Pain management: satisfactory to patient Respiratory status: spontaneous breathing Cardiovascular status: blood pressure returned to baseline Anesthetic complications: no   No notable events documented.   Last Vitals:  Vitals:   11/27/23 1329  BP: (!) 118/96  Pulse: 65  Resp: 20  Temp: (!) 35.6 C  SpO2: 100%    Last Pain:  Vitals:   11/27/23 1410  TempSrc: Temporal  PainSc: Asleep                 VAN STAVEREN,Dior Stepter

## 2023-11-27 NOTE — Op Note (Signed)
 [facility] Patient Name: Dwayne Tucker Procedure Date: 11/27/2023 1:47 PM MRN: [id number] Account #: [id number] Date of Birth: Apr 25, 1970 Admit Type: Outpatient Age: 53 Room: [facility] ENDO ROOM 3 Gender: Male Note Status: Finalized Instrument Name: Colon Scope [id number] Procedure:             Colonoscopy Indications:           Screening for colorectal malignant neoplasm Providers:             Ole Schick MD, MD Referring MD:          No Local Md, MD (Referring MD) Medicines:             Monitored Anesthesia Care Complications:         No immediate complications. Estimated blood loss:                         Minimal. Procedure:             Pre-Anesthesia Assessment:                        - Prior to the procedure, a History and Physical was                         performed, and patient medications and allergies were                         reviewed. The patient is competent. The risks and                         benefits of the procedure and the sedation options and                         risks were discussed with the patient. All questions                         were answered and informed consent was obtained.                         Patient identification and proposed procedure were                         verified by the physician, the nurse, the                         anesthesiologist, the anesthetist and the technician                         in the endoscopy suite. Mental Status Examination:                         alert and oriented. Airway Examination: normal                         oropharyngeal airway and neck mobility. Respiratory                         Examination: clear to auscultation. CV Examination:  normal. Prophylactic Antibiotics: The patient does not                         require prophylactic antibiotics. Prior                         Anticoagulants: The patient has taken no anticoagulant                          or antiplatelet agents. ASA Grade Assessment: II - A                         patient with mild systemic disease. After reviewing                         the risks and benefits, the patient was deemed in                         satisfactory condition to undergo the procedure. The                         anesthesia plan was to use monitored anesthesia care                         (MAC). Immediately prior to administration of                         medications, the patient was re-assessed for adequacy                         to receive sedatives. The heart rate, respiratory                         rate, oxygen saturations, blood pressure, adequacy of                         pulmonary ventilation, and response to care were                         monitored throughout the procedure. The physical                         status of the patient was re-assessed after the                         procedure.                        After obtaining informed consent, the colonoscope was                         passed under direct vision. Throughout the procedure,                         the patient's blood pressure, pulse, and oxygen                         saturations were monitored continuously. The  Colonoscope was introduced through the anus and                         advanced to the the terminal ileum, with                         identification of the appendiceal orifice and IC                         valve. The colonoscopy was performed without                         difficulty. The patient tolerated the procedure well.                         The quality of the bowel preparation was good. The                         terminal ileum, ileocecal valve, appendiceal orifice,                         and rectum were photographed. Findings:      The perianal and digital rectal examinations were normal.      The terminal ileum appeared normal.      A few  small-mouthed diverticula were found in the ascending colon.      A 1 mm polyp was found in the rectum. The polyp was sessile. The polyp       was removed with a jumbo cold forceps. Resection and retrieval were       complete. Estimated blood loss was minimal.      Internal hemorrhoids were found during retroflexion. The hemorrhoids       were Grade I (internal hemorrhoids that do not prolapse).      The exam was otherwise without abnormality on direct and retroflexion       views. Impression:            - The examined portion of the ileum was normal.                        - Diverticulosis in the ascending colon.                        - One 1 mm polyp in the rectum, removed with a jumbo                         cold forceps. Resected and retrieved.                        - Internal hemorrhoids.                        - The examination was otherwise normal on direct and                         retroflexion views. Recommendation:        - Discharge patient to home.                        - Resume previous diet.                        -  Continue present medications.                        - Await pathology results.                        - Repeat colonoscopy in 7-10 years for surveillance.                        - Return to referring physician as previously                         scheduled. Procedure Code(s):     --- Professional ---                        [phone], Colonoscopy, flexible; with biopsy, single or                         multiple Diagnosis Code(s):     --- Professional ---                        Z12.11, Encounter for screening for malignant neoplasm                         of colon                        K64.0, First degree hemorrhoids                        D12.8, Benign neoplasm of rectum                        K57.30, Diverticulosis of large intestine without                         perforation or abscess without bleeding CPT copyright 2022 [employer].  All rights reserved. The codes documented in this report are preliminary and upon coder review may  be revised to meet current compliance requirements. Ole Schick MD, MD 11/27/2023 2:17:03 PM Number of Addenda: 0 Note Initiated On: 11/27/2023 1:47 PM Scope Withdrawal Time: 0 hours 8 minutes 26 seconds  Total Procedure Duration: 0 hours 11 minutes 39 seconds  Estimated Blood Loss:  Estimated blood loss was minimal.      [facility]

## 2023-11-27 NOTE — Transfer of Care (Signed)
 Immediate Anesthesia Transfer of Care Note  Patient: Dwayne Tucker  Procedure(s) Performed: COLONOSCOPY POLYPECTOMY, INTESTINE  Patient Location: PACU  Anesthesia Type:General  Level of Consciousness: sedated  Airway & Oxygen Therapy: Patient Spontanous Breathing  Post-op Assessment: Report given to RN and Post -op Vital signs reviewed and stable  Post vital signs: Reviewed and stable  Last Vitals:  Vitals Value Taken Time  BP    Temp    Pulse 65 11/27/23 14:10  Resp 16 11/27/23 14:10  SpO2 99 % 11/27/23 14:10  Vitals shown include unfiled device data.  Last Pain:  Vitals:   11/27/23 1329  TempSrc: Temporal  PainSc: 0-No pain         Complications: No notable events documented.

## 2023-11-27 NOTE — Anesthesia Preprocedure Evaluation (Signed)
 Anesthesia Evaluation  Patient identified by MRN, date of birth, ID band Patient awake    Reviewed: Allergy & Precautions, NPO status , Patient's Chart, lab work & pertinent test results  Airway Mallampati: II  TM Distance: >3 FB Neck ROM: Full    Dental  (+) Teeth Intact   Pulmonary neg pulmonary ROS   Pulmonary exam normal breath sounds clear to auscultation       Cardiovascular Exercise Tolerance: Good negative cardio ROS Normal cardiovascular exam Rhythm:Regular Rate:Normal     Neuro/Psych negative neurological ROS  negative psych ROS   GI/Hepatic negative GI ROS, Neg liver ROS,,,  Endo/Other  negative endocrine ROS    Renal/GU negative Renal ROS  negative genitourinary   Musculoskeletal   Abdominal Normal abdominal exam  (+)   Peds negative pediatric ROS (+)  Hematology negative hematology ROS (+)   Anesthesia Other Findings History reviewed. No pertinent past medical history.  History reviewed. No pertinent surgical history.     Reproductive/Obstetrics negative OB ROS                              Anesthesia Physical Anesthesia Plan  ASA: 2  Anesthesia Plan: General   Post-op Pain Management:    Induction: Intravenous  PONV Risk Score and Plan: Propofol infusion and TIVA  Airway Management Planned: Natural Airway and Nasal Cannula  Additional Equipment:   Intra-op Plan:   Post-operative Plan:   Informed Consent: I have reviewed the patients History and Physical, chart, labs and discussed the procedure including the risks, benefits and alternatives for the proposed anesthesia with the patient or authorized representative who has indicated his/her understanding and acceptance.     Dental Advisory Given  Plan Discussed with: CRNA and Surgeon  Anesthesia Plan Comments:         Anesthesia Quick Evaluation

## 2023-11-27 NOTE — Interval H&P Note (Signed)
 History and Physical Interval Note:  11/27/2023 1:46 PM  Dwayne Tucker  has presented today for surgery, with the diagnosis of Colon cancer screening (Z12.11).  The various methods of treatment have been discussed with the patient and family. After consideration of risks, benefits and other options for treatment, the patient has consented to  Procedure(s): COLONOSCOPY (N/A) as a surgical intervention.  The patient's history has been reviewed, patient examined, no change in status, stable for surgery.  I have reviewed the patient's chart and labs.  Questions were answered to the patient's satisfaction.     Dwayne Tucker  Ok to proceed with colonoscopy

## 2023-11-30 LAB — SURGICAL PATHOLOGY
